# Patient Record
Sex: Female | Born: 2000 | Hispanic: Yes | Marital: Single | Smoking: Never smoker
Health system: Southern US, Community
[De-identification: ages and names within clinical notes are randomized; demographics above are authoritative.]

## PROBLEM LIST (undated history)

## (undated) DIAGNOSIS — Z464 Encounter for fitting and adjustment of orthodontic device: Secondary | ICD-10-CM

## (undated) DIAGNOSIS — Z789 Other specified health status: Secondary | ICD-10-CM

## (undated) DIAGNOSIS — Z8489 Family history of other specified conditions: Secondary | ICD-10-CM

## (undated) HISTORY — PX: KNEE ARTHROSCOPY: SUR90

## (undated) HISTORY — PX: NO PAST SURGERIES: SHX2092

---
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
## 2022-01-18 DIAGNOSIS — Z419 Encounter for procedure for purposes other than remedying health state, unspecified: Secondary | ICD-10-CM | POA: Diagnosis not present

## 2022-02-17 DIAGNOSIS — Z419 Encounter for procedure for purposes other than remedying health state, unspecified: Secondary | ICD-10-CM | POA: Diagnosis not present

## 2022-03-20 DIAGNOSIS — Z419 Encounter for procedure for purposes other than remedying health state, unspecified: Secondary | ICD-10-CM | POA: Diagnosis not present

## 2022-04-19 DIAGNOSIS — Z419 Encounter for procedure for purposes other than remedying health state, unspecified: Secondary | ICD-10-CM | POA: Diagnosis not present

## 2022-05-20 DIAGNOSIS — Z419 Encounter for procedure for purposes other than remedying health state, unspecified: Secondary | ICD-10-CM | POA: Diagnosis not present

## 2022-06-20 DIAGNOSIS — Z419 Encounter for procedure for purposes other than remedying health state, unspecified: Secondary | ICD-10-CM | POA: Diagnosis not present

## 2022-07-19 DIAGNOSIS — Z419 Encounter for procedure for purposes other than remedying health state, unspecified: Secondary | ICD-10-CM | POA: Diagnosis not present

## 2022-08-19 DIAGNOSIS — Z419 Encounter for procedure for purposes other than remedying health state, unspecified: Secondary | ICD-10-CM | POA: Diagnosis not present

## 2022-09-18 DIAGNOSIS — Z419 Encounter for procedure for purposes other than remedying health state, unspecified: Secondary | ICD-10-CM | POA: Diagnosis not present

## 2022-10-19 DIAGNOSIS — Z419 Encounter for procedure for purposes other than remedying health state, unspecified: Secondary | ICD-10-CM | POA: Diagnosis not present

## 2022-11-18 DIAGNOSIS — Z419 Encounter for procedure for purposes other than remedying health state, unspecified: Secondary | ICD-10-CM | POA: Diagnosis not present
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
